# Patient Record
Sex: Male | Born: 1985 | Race: White | Hispanic: No | Marital: Married | Smoking: Never smoker
Health system: Southern US, Community
[De-identification: ages and names within clinical notes are randomized; demographics above are authoritative.]

## PROBLEM LIST (undated history)

## (undated) DIAGNOSIS — Z87442 Personal history of urinary calculi: Secondary | ICD-10-CM

## (undated) DIAGNOSIS — Z8739 Personal history of other diseases of the musculoskeletal system and connective tissue: Secondary | ICD-10-CM

## (undated) HISTORY — PX: TRACHEOSTOMY: SUR1362

## (undated) HISTORY — PX: SKIN GRAFT SPLIT THICKNESS LEG / FOOT: SUR1303

## (undated) HISTORY — PX: OTHER SURGICAL HISTORY: SHX169

---
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
## 2006-11-23 ENCOUNTER — Emergency Department (HOSPITAL_COMMUNITY): Admission: EM | Admit: 2006-11-23 | Discharge: 2006-11-23 | Payer: Self-pay | Admitting: Emergency Medicine

## 2006-12-01 ENCOUNTER — Emergency Department (HOSPITAL_COMMUNITY): Admission: EM | Admit: 2006-12-01 | Discharge: 2006-12-01 | Payer: Self-pay | Admitting: Emergency Medicine
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
## 2016-09-20 DIAGNOSIS — Z452 Encounter for adjustment and management of vascular access device: Secondary | ICD-10-CM | POA: Diagnosis not present

## 2016-09-20 DIAGNOSIS — I10 Essential (primary) hypertension: Secondary | ICD-10-CM | POA: Diagnosis not present

## 2016-09-20 DIAGNOSIS — R918 Other nonspecific abnormal finding of lung field: Secondary | ICD-10-CM | POA: Diagnosis not present

## 2016-09-20 DIAGNOSIS — J15212 Pneumonia due to Methicillin resistant Staphylococcus aureus: Secondary | ICD-10-CM | POA: Diagnosis not present

## 2016-09-20 DIAGNOSIS — E43 Unspecified severe protein-calorie malnutrition: Secondary | ICD-10-CM | POA: Diagnosis not present

## 2016-09-20 DIAGNOSIS — T1490XA Injury, unspecified, initial encounter: Secondary | ICD-10-CM | POA: Diagnosis not present

## 2016-09-20 DIAGNOSIS — J9602 Acute respiratory failure with hypercapnia: Secondary | ICD-10-CM | POA: Diagnosis not present

## 2016-09-20 DIAGNOSIS — T22291A Burn of second degree of multiple sites of right shoulder and upper limb, except wrist and hand, initial encounter: Secondary | ICD-10-CM | POA: Diagnosis not present

## 2016-09-20 DIAGNOSIS — J705 Respiratory conditions due to smoke inhalation: Secondary | ICD-10-CM | POA: Diagnosis not present

## 2016-09-20 DIAGNOSIS — T5891XA Toxic effect of carbon monoxide from unspecified source, accidental (unintentional), initial encounter: Secondary | ICD-10-CM | POA: Diagnosis not present

## 2016-09-20 DIAGNOSIS — Z4682 Encounter for fitting and adjustment of non-vascular catheter: Secondary | ICD-10-CM | POA: Diagnosis not present

## 2016-09-20 DIAGNOSIS — T2029XA Burn of second degree of multiple sites of head, face, and neck, initial encounter: Secondary | ICD-10-CM | POA: Diagnosis not present

## 2016-09-20 DIAGNOSIS — R6510 Systemic inflammatory response syndrome (SIRS) of non-infectious origin without acute organ dysfunction: Secondary | ICD-10-CM | POA: Diagnosis not present

## 2016-09-20 DIAGNOSIS — T310 Burns involving less than 10% of body surface: Secondary | ICD-10-CM | POA: Diagnosis not present

## 2016-09-20 DIAGNOSIS — T300 Burn of unspecified body region, unspecified degree: Secondary | ICD-10-CM | POA: Diagnosis not present

## 2016-09-20 DIAGNOSIS — T3111 Burns involving 10-19% of body surface with 10-19% third degree burns: Secondary | ICD-10-CM | POA: Diagnosis not present

## 2016-09-20 DIAGNOSIS — T22311A Burn of third degree of right forearm, initial encounter: Secondary | ICD-10-CM | POA: Diagnosis not present

## 2016-09-20 DIAGNOSIS — Z9911 Dependence on respirator [ventilator] status: Secondary | ICD-10-CM | POA: Diagnosis not present

## 2016-09-20 DIAGNOSIS — T3110 Burns involving 10-19% of body surface with 0% to 9% third degree burns: Secondary | ICD-10-CM | POA: Diagnosis not present

## 2016-09-20 DIAGNOSIS — J811 Chronic pulmonary edema: Secondary | ICD-10-CM | POA: Diagnosis not present

## 2016-09-20 DIAGNOSIS — J9811 Atelectasis: Secondary | ICD-10-CM | POA: Diagnosis not present

## 2016-09-20 DIAGNOSIS — S299XXA Unspecified injury of thorax, initial encounter: Secondary | ICD-10-CM | POA: Diagnosis not present

## 2016-09-20 DIAGNOSIS — X000XXA Exposure to flames in uncontrolled fire in building or structure, initial encounter: Secondary | ICD-10-CM | POA: Diagnosis not present

## 2016-09-20 DIAGNOSIS — A419 Sepsis, unspecified organism: Secondary | ICD-10-CM | POA: Diagnosis not present

## 2016-09-20 DIAGNOSIS — T22292A Burn of second degree of multiple sites of left shoulder and upper limb, except wrist and hand, initial encounter: Secondary | ICD-10-CM | POA: Diagnosis not present

## 2016-09-20 DIAGNOSIS — J9601 Acute respiratory failure with hypoxia: Secondary | ICD-10-CM | POA: Diagnosis not present

## 2016-09-24 DIAGNOSIS — R0602 Shortness of breath: Secondary | ICD-10-CM | POA: Diagnosis not present

## 2016-09-24 DIAGNOSIS — R4182 Altered mental status, unspecified: Secondary | ICD-10-CM | POA: Diagnosis not present

## 2016-09-24 DIAGNOSIS — J9811 Atelectasis: Secondary | ICD-10-CM | POA: Diagnosis not present

## 2016-09-24 DIAGNOSIS — J9602 Acute respiratory failure with hypercapnia: Secondary | ICD-10-CM | POA: Diagnosis not present

## 2016-09-24 DIAGNOSIS — T5891XA Toxic effect of carbon monoxide from unspecified source, accidental (unintentional), initial encounter: Secondary | ICD-10-CM | POA: Diagnosis not present

## 2016-09-24 DIAGNOSIS — J988 Other specified respiratory disorders: Secondary | ICD-10-CM | POA: Diagnosis not present

## 2016-09-24 DIAGNOSIS — J189 Pneumonia, unspecified organism: Secondary | ICD-10-CM | POA: Diagnosis not present

## 2016-09-24 DIAGNOSIS — D72829 Elevated white blood cell count, unspecified: Secondary | ICD-10-CM | POA: Diagnosis not present

## 2016-09-24 DIAGNOSIS — J90 Pleural effusion, not elsewhere classified: Secondary | ICD-10-CM | POA: Diagnosis not present

## 2016-09-24 DIAGNOSIS — F05 Delirium due to known physiological condition: Secondary | ICD-10-CM | POA: Diagnosis not present

## 2016-09-24 DIAGNOSIS — B9562 Methicillin resistant Staphylococcus aureus infection as the cause of diseases classified elsewhere: Secondary | ICD-10-CM | POA: Diagnosis not present

## 2016-09-24 DIAGNOSIS — Z9911 Dependence on respirator [ventilator] status: Secondary | ICD-10-CM | POA: Diagnosis not present

## 2016-09-24 DIAGNOSIS — R1310 Dysphagia, unspecified: Secondary | ICD-10-CM | POA: Diagnosis not present

## 2016-09-24 DIAGNOSIS — J15212 Pneumonia due to Methicillin resistant Staphylococcus aureus: Secondary | ICD-10-CM | POA: Diagnosis not present

## 2016-09-24 DIAGNOSIS — R509 Fever, unspecified: Secondary | ICD-10-CM | POA: Diagnosis not present

## 2016-09-24 DIAGNOSIS — T2029XA Burn of second degree of multiple sites of head, face, and neck, initial encounter: Secondary | ICD-10-CM | POA: Diagnosis not present

## 2016-09-24 DIAGNOSIS — R0989 Other specified symptoms and signs involving the circulatory and respiratory systems: Secondary | ICD-10-CM | POA: Diagnosis not present

## 2016-09-24 DIAGNOSIS — H532 Diplopia: Secondary | ICD-10-CM | POA: Diagnosis not present

## 2016-09-24 DIAGNOSIS — T3110 Burns involving 10-19% of body surface with 0% to 9% third degree burns: Secondary | ICD-10-CM | POA: Diagnosis not present

## 2016-09-24 DIAGNOSIS — R160 Hepatomegaly, not elsewhere classified: Secondary | ICD-10-CM | POA: Diagnosis not present

## 2016-09-24 DIAGNOSIS — I824Z3 Acute embolism and thrombosis of unspecified deep veins of distal lower extremity, bilateral: Secondary | ICD-10-CM | POA: Diagnosis not present

## 2016-09-24 DIAGNOSIS — Z430 Encounter for attention to tracheostomy: Secondary | ICD-10-CM | POA: Diagnosis not present

## 2016-09-24 DIAGNOSIS — I82403 Acute embolism and thrombosis of unspecified deep veins of lower extremity, bilateral: Secondary | ICD-10-CM | POA: Diagnosis not present

## 2016-09-24 DIAGNOSIS — H5052 Exophoria: Secondary | ICD-10-CM | POA: Diagnosis not present

## 2016-09-24 DIAGNOSIS — K6389 Other specified diseases of intestine: Secondary | ICD-10-CM | POA: Diagnosis not present

## 2016-09-24 DIAGNOSIS — Z930 Tracheostomy status: Secondary | ICD-10-CM | POA: Diagnosis not present

## 2016-09-24 DIAGNOSIS — J95851 Ventilator associated pneumonia: Secondary | ICD-10-CM | POA: Diagnosis not present

## 2016-09-24 DIAGNOSIS — A419 Sepsis, unspecified organism: Secondary | ICD-10-CM | POA: Diagnosis not present

## 2016-09-24 DIAGNOSIS — B964 Proteus (mirabilis) (morganii) as the cause of diseases classified elsewhere: Secondary | ICD-10-CM | POA: Diagnosis not present

## 2016-09-24 DIAGNOSIS — E43 Unspecified severe protein-calorie malnutrition: Secondary | ICD-10-CM | POA: Diagnosis not present

## 2016-09-24 DIAGNOSIS — R000 Tachycardia, unspecified: Secondary | ICD-10-CM | POA: Diagnosis not present

## 2016-09-24 DIAGNOSIS — J9600 Acute respiratory failure, unspecified whether with hypoxia or hypercapnia: Secondary | ICD-10-CM | POA: Diagnosis not present

## 2016-09-24 DIAGNOSIS — J155 Pneumonia due to Escherichia coli: Secondary | ICD-10-CM | POA: Diagnosis not present

## 2016-09-24 DIAGNOSIS — T22312A Burn of third degree of left forearm, initial encounter: Secondary | ICD-10-CM | POA: Diagnosis not present

## 2016-09-24 DIAGNOSIS — Z9889 Other specified postprocedural states: Secondary | ICD-10-CM | POA: Diagnosis not present

## 2016-09-24 DIAGNOSIS — J984 Other disorders of lung: Secondary | ICD-10-CM | POA: Diagnosis not present

## 2016-09-24 DIAGNOSIS — T300 Burn of unspecified body region, unspecified degree: Secondary | ICD-10-CM | POA: Diagnosis not present

## 2016-09-24 DIAGNOSIS — J9601 Acute respiratory failure with hypoxia: Secondary | ICD-10-CM | POA: Diagnosis not present

## 2016-09-24 DIAGNOSIS — T3111 Burns involving 10-19% of body surface with 10-19% third degree burns: Secondary | ICD-10-CM | POA: Diagnosis not present

## 2016-09-24 DIAGNOSIS — D62 Acute posthemorrhagic anemia: Secondary | ICD-10-CM | POA: Diagnosis not present

## 2016-09-24 DIAGNOSIS — T23392A Burn of third degree of multiple sites of left wrist and hand, initial encounter: Secondary | ICD-10-CM | POA: Diagnosis not present

## 2016-09-24 DIAGNOSIS — R410 Disorientation, unspecified: Secondary | ICD-10-CM | POA: Diagnosis not present

## 2016-09-24 DIAGNOSIS — T23361A Burn of third degree of back of right hand, initial encounter: Secondary | ICD-10-CM | POA: Diagnosis not present

## 2016-09-24 DIAGNOSIS — Z4682 Encounter for fitting and adjustment of non-vascular catheter: Secondary | ICD-10-CM | POA: Diagnosis not present

## 2016-09-24 DIAGNOSIS — T23362A Burn of third degree of back of left hand, initial encounter: Secondary | ICD-10-CM | POA: Diagnosis not present

## 2016-09-24 DIAGNOSIS — R6521 Severe sepsis with septic shock: Secondary | ICD-10-CM | POA: Diagnosis not present

## 2016-09-24 DIAGNOSIS — T1490XA Injury, unspecified, initial encounter: Secondary | ICD-10-CM | POA: Diagnosis not present

## 2016-09-24 DIAGNOSIS — R579 Shock, unspecified: Secondary | ICD-10-CM | POA: Diagnosis not present

## 2016-09-24 DIAGNOSIS — T310 Burns involving less than 10% of body surface: Secondary | ICD-10-CM | POA: Diagnosis not present

## 2016-09-24 DIAGNOSIS — B9620 Unspecified Escherichia coli [E. coli] as the cause of diseases classified elsewhere: Secondary | ICD-10-CM | POA: Diagnosis not present

## 2016-09-24 DIAGNOSIS — T797XXA Traumatic subcutaneous emphysema, initial encounter: Secondary | ICD-10-CM | POA: Diagnosis not present

## 2016-09-24 DIAGNOSIS — R0902 Hypoxemia: Secondary | ICD-10-CM | POA: Diagnosis not present

## 2016-09-24 DIAGNOSIS — J811 Chronic pulmonary edema: Secondary | ICD-10-CM | POA: Diagnosis not present

## 2016-09-24 DIAGNOSIS — T23391A Burn of third degree of multiple sites of right wrist and hand, initial encounter: Secondary | ICD-10-CM | POA: Diagnosis not present

## 2016-09-24 DIAGNOSIS — J705 Respiratory conditions due to smoke inhalation: Secondary | ICD-10-CM | POA: Diagnosis not present

## 2016-09-24 DIAGNOSIS — E873 Alkalosis: Secondary | ICD-10-CM | POA: Diagnosis not present

## 2016-09-24 DIAGNOSIS — T22311A Burn of third degree of right forearm, initial encounter: Secondary | ICD-10-CM | POA: Diagnosis not present

## 2016-09-24 DIAGNOSIS — R358 Other polyuria: Secondary | ICD-10-CM | POA: Diagnosis not present

## 2016-09-24 DIAGNOSIS — Z87898 Personal history of other specified conditions: Secondary | ICD-10-CM | POA: Diagnosis not present

## 2016-09-24 DIAGNOSIS — R918 Other nonspecific abnormal finding of lung field: Secondary | ICD-10-CM | POA: Diagnosis not present

## 2016-09-24 DIAGNOSIS — I2699 Other pulmonary embolism without acute cor pulmonale: Secondary | ICD-10-CM | POA: Diagnosis not present

## 2016-09-24 DIAGNOSIS — R451 Restlessness and agitation: Secondary | ICD-10-CM | POA: Diagnosis not present

## 2016-09-24 DIAGNOSIS — Z01810 Encounter for preprocedural cardiovascular examination: Secondary | ICD-10-CM | POA: Diagnosis not present

## 2016-09-24 DIAGNOSIS — R6510 Systemic inflammatory response syndrome (SIRS) of non-infectious origin without acute organ dysfunction: Secondary | ICD-10-CM | POA: Diagnosis not present

## 2016-09-24 DIAGNOSIS — R197 Diarrhea, unspecified: Secondary | ICD-10-CM | POA: Diagnosis not present

## 2016-09-24 DIAGNOSIS — Z452 Encounter for adjustment and management of vascular access device: Secondary | ICD-10-CM | POA: Diagnosis not present

## 2016-11-07 DIAGNOSIS — M79675 Pain in left toe(s): Secondary | ICD-10-CM | POA: Diagnosis not present

## 2016-11-07 DIAGNOSIS — Z6826 Body mass index (BMI) 26.0-26.9, adult: Secondary | ICD-10-CM | POA: Diagnosis not present

## 2016-11-07 DIAGNOSIS — T22311S Burn of third degree of right forearm, sequela: Secondary | ICD-10-CM | POA: Diagnosis not present

## 2016-11-07 DIAGNOSIS — I82403 Acute embolism and thrombosis of unspecified deep veins of lower extremity, bilateral: Secondary | ICD-10-CM | POA: Diagnosis not present

## 2016-11-07 DIAGNOSIS — T22312S Burn of third degree of left forearm, sequela: Secondary | ICD-10-CM | POA: Diagnosis not present

## 2016-11-07 DIAGNOSIS — M79672 Pain in left foot: Secondary | ICD-10-CM | POA: Diagnosis not present

## 2016-11-14 DIAGNOSIS — T2039XD Burn of third degree of multiple sites of head, face, and neck, subsequent encounter: Secondary | ICD-10-CM | POA: Diagnosis not present

## 2016-11-14 DIAGNOSIS — T22311D Burn of third degree of right forearm, subsequent encounter: Secondary | ICD-10-CM | POA: Diagnosis not present

## 2016-11-14 DIAGNOSIS — T22312D Burn of third degree of left forearm, subsequent encounter: Secondary | ICD-10-CM | POA: Diagnosis not present

## 2016-11-14 DIAGNOSIS — T23302D Burn of third degree of left hand, unspecified site, subsequent encounter: Secondary | ICD-10-CM | POA: Diagnosis not present

## 2016-11-14 DIAGNOSIS — Z7901 Long term (current) use of anticoagulants: Secondary | ICD-10-CM | POA: Diagnosis not present

## 2016-11-14 DIAGNOSIS — T23301D Burn of third degree of right hand, unspecified site, subsequent encounter: Secondary | ICD-10-CM | POA: Diagnosis not present

## 2016-11-28 DIAGNOSIS — T22311A Burn of third degree of right forearm, initial encounter: Secondary | ICD-10-CM | POA: Diagnosis not present

## 2016-11-28 DIAGNOSIS — T22312A Burn of third degree of left forearm, initial encounter: Secondary | ICD-10-CM | POA: Diagnosis not present

## 2016-11-28 DIAGNOSIS — Z945 Skin transplant status: Secondary | ICD-10-CM | POA: Diagnosis not present

## 2016-11-28 DIAGNOSIS — T2039XA Burn of third degree of multiple sites of head, face, and neck, initial encounter: Secondary | ICD-10-CM | POA: Diagnosis not present

## 2016-11-28 DIAGNOSIS — T59811A Toxic effect of smoke, accidental (unintentional), initial encounter: Secondary | ICD-10-CM | POA: Diagnosis not present

## 2016-11-28 DIAGNOSIS — T23301A Burn of third degree of right hand, unspecified site, initial encounter: Secondary | ICD-10-CM | POA: Diagnosis not present

## 2016-11-28 DIAGNOSIS — J705 Respiratory conditions due to smoke inhalation: Secondary | ICD-10-CM | POA: Diagnosis not present

## 2016-11-28 DIAGNOSIS — T23302A Burn of third degree of left hand, unspecified site, initial encounter: Secondary | ICD-10-CM | POA: Diagnosis not present

## 2016-11-28 DIAGNOSIS — T3111 Burns involving 10-19% of body surface with 10-19% third degree burns: Secondary | ICD-10-CM | POA: Diagnosis not present

## 2016-12-12 DIAGNOSIS — T23302D Burn of third degree of left hand, unspecified site, subsequent encounter: Secondary | ICD-10-CM | POA: Diagnosis not present

## 2016-12-12 DIAGNOSIS — T23301D Burn of third degree of right hand, unspecified site, subsequent encounter: Secondary | ICD-10-CM | POA: Diagnosis not present

## 2016-12-12 DIAGNOSIS — T3111 Burns involving 10-19% of body surface with 10-19% third degree burns: Secondary | ICD-10-CM | POA: Diagnosis not present

## 2016-12-12 DIAGNOSIS — T20312D Burn of third degree of left ear [any part, except ear drum], subsequent encounter: Secondary | ICD-10-CM | POA: Diagnosis not present

## 2016-12-12 DIAGNOSIS — T2035XD Burn of third degree of scalp [any part], subsequent encounter: Secondary | ICD-10-CM | POA: Diagnosis not present

## 2016-12-12 DIAGNOSIS — T22311D Burn of third degree of right forearm, subsequent encounter: Secondary | ICD-10-CM | POA: Diagnosis not present

## 2016-12-12 DIAGNOSIS — T22312D Burn of third degree of left forearm, subsequent encounter: Secondary | ICD-10-CM | POA: Diagnosis not present

## 2016-12-12 DIAGNOSIS — T20311D Burn of third degree of right ear [any part, except ear drum], subsequent encounter: Secondary | ICD-10-CM | POA: Diagnosis not present

## 2016-12-26 DIAGNOSIS — T22312D Burn of third degree of left forearm, subsequent encounter: Secondary | ICD-10-CM | POA: Diagnosis not present

## 2016-12-26 DIAGNOSIS — T22311D Burn of third degree of right forearm, subsequent encounter: Secondary | ICD-10-CM | POA: Diagnosis not present

## 2016-12-26 DIAGNOSIS — T310 Burns involving less than 10% of body surface: Secondary | ICD-10-CM | POA: Diagnosis not present

## 2016-12-26 DIAGNOSIS — T23301D Burn of third degree of right hand, unspecified site, subsequent encounter: Secondary | ICD-10-CM | POA: Diagnosis not present

## 2016-12-26 DIAGNOSIS — T20311D Burn of third degree of right ear [any part, except ear drum], subsequent encounter: Secondary | ICD-10-CM | POA: Diagnosis not present

## 2016-12-26 DIAGNOSIS — T20312D Burn of third degree of left ear [any part, except ear drum], subsequent encounter: Secondary | ICD-10-CM | POA: Diagnosis not present

## 2016-12-26 DIAGNOSIS — T23302D Burn of third degree of left hand, unspecified site, subsequent encounter: Secondary | ICD-10-CM | POA: Diagnosis not present

## 2016-12-26 DIAGNOSIS — T2035XD Burn of third degree of scalp [any part], subsequent encounter: Secondary | ICD-10-CM | POA: Diagnosis not present

## 2016-12-26 DIAGNOSIS — F05 Delirium due to known physiological condition: Secondary | ICD-10-CM | POA: Diagnosis not present

## 2016-12-27 MED FILL — XARELTO 20 MG TABLET: 20 | 30 days supply | Qty: 30 | Fill #0

## 2017-01-02 DIAGNOSIS — M79672 Pain in left foot: Secondary | ICD-10-CM | POA: Diagnosis not present

## 2017-01-02 DIAGNOSIS — T22312S Burn of third degree of left forearm, sequela: Secondary | ICD-10-CM | POA: Diagnosis not present

## 2017-01-02 DIAGNOSIS — M79675 Pain in left toe(s): Secondary | ICD-10-CM | POA: Diagnosis not present

## 2017-01-02 DIAGNOSIS — I82403 Acute embolism and thrombosis of unspecified deep veins of lower extremity, bilateral: Secondary | ICD-10-CM | POA: Diagnosis not present

## 2017-01-02 DIAGNOSIS — Z6826 Body mass index (BMI) 26.0-26.9, adult: Secondary | ICD-10-CM | POA: Diagnosis not present

## 2017-01-02 DIAGNOSIS — T22311S Burn of third degree of right forearm, sequela: Secondary | ICD-10-CM | POA: Diagnosis not present

## 2017-01-30 MED FILL — XARELTO 20 MG TABLET: 20 | 30 days supply | Qty: 30 | Fill #1

## 2017-03-06 MED FILL — XARELTO 20 MG TABLET: 20 | 30 days supply | Qty: 30 | Fill #0

## 2017-03-29 DIAGNOSIS — T20011D Burn of unspecified degree of right ear [any part, except ear drum], subsequent encounter: Secondary | ICD-10-CM | POA: Diagnosis not present

## 2017-03-29 DIAGNOSIS — Z1389 Encounter for screening for other disorder: Secondary | ICD-10-CM | POA: Diagnosis not present

## 2017-03-29 DIAGNOSIS — T20012D Burn of unspecified degree of left ear [any part, except ear drum], subsequent encounter: Secondary | ICD-10-CM | POA: Diagnosis not present

## 2017-03-29 DIAGNOSIS — T2005XD Burn of unspecified degree of scalp [any part], subsequent encounter: Secondary | ICD-10-CM | POA: Diagnosis not present

## 2017-03-29 DIAGNOSIS — T23001D Burn of unspecified degree of right hand, unspecified site, subsequent encounter: Secondary | ICD-10-CM | POA: Diagnosis not present

## 2017-03-29 DIAGNOSIS — T22011D Burn of unspecified degree of right forearm, subsequent encounter: Secondary | ICD-10-CM | POA: Diagnosis not present

## 2017-03-29 DIAGNOSIS — T3110 Burns involving 10-19% of body surface with 0% to 9% third degree burns: Secondary | ICD-10-CM | POA: Diagnosis not present

## 2017-03-29 DIAGNOSIS — M79675 Pain in left toe(s): Secondary | ICD-10-CM | POA: Diagnosis not present

## 2017-03-29 DIAGNOSIS — T22012D Burn of unspecified degree of left forearm, subsequent encounter: Secondary | ICD-10-CM | POA: Diagnosis not present

## 2017-03-29 DIAGNOSIS — T22311S Burn of third degree of right forearm, sequela: Secondary | ICD-10-CM | POA: Diagnosis not present

## 2017-03-29 DIAGNOSIS — M79672 Pain in left foot: Secondary | ICD-10-CM | POA: Diagnosis not present

## 2017-03-29 DIAGNOSIS — T22312S Burn of third degree of left forearm, sequela: Secondary | ICD-10-CM | POA: Diagnosis not present

## 2017-03-29 DIAGNOSIS — Z6826 Body mass index (BMI) 26.0-26.9, adult: Secondary | ICD-10-CM | POA: Diagnosis not present

## 2017-03-29 DIAGNOSIS — T2009XD Burn of unspecified degree of multiple sites of head, face, and neck, subsequent encounter: Secondary | ICD-10-CM | POA: Diagnosis not present

## 2017-03-29 DIAGNOSIS — T23002D Burn of unspecified degree of left hand, unspecified site, subsequent encounter: Secondary | ICD-10-CM | POA: Diagnosis not present

## 2017-03-29 DIAGNOSIS — I82403 Acute embolism and thrombosis of unspecified deep veins of lower extremity, bilateral: Secondary | ICD-10-CM | POA: Diagnosis not present

## 2017-04-02 ENCOUNTER — Other Ambulatory Visit (HOSPITAL_COMMUNITY): Payer: Self-pay | Admitting: Physician Assistant

## 2017-04-02 DIAGNOSIS — Z86711 Personal history of pulmonary embolism: Secondary | ICD-10-CM

## 2017-04-02 DIAGNOSIS — R0602 Shortness of breath: Secondary | ICD-10-CM

## 2017-04-08 ENCOUNTER — Encounter (HOSPITAL_COMMUNITY): Payer: Self-pay

## 2017-04-08 ENCOUNTER — Ambulatory Visit (HOSPITAL_COMMUNITY)
Admission: RE | Admit: 2017-04-08 | Discharge: 2017-04-08 | Disposition: A | Discharge status: Home / Self Care | Payer: 59 | Source: Ambulatory Visit | Attending: Physician Assistant | Admitting: Physician Assistant

## 2017-04-08 DIAGNOSIS — I82403 Acute embolism and thrombosis of unspecified deep veins of lower extremity, bilateral: Secondary | ICD-10-CM | POA: Insufficient documentation

## 2017-04-08 DIAGNOSIS — R0602 Shortness of breath: Secondary | ICD-10-CM | POA: Insufficient documentation

## 2017-04-08 DIAGNOSIS — Z86711 Personal history of pulmonary embolism: Secondary | ICD-10-CM

## 2017-04-08 MED ORDER — IOPAMIDOL (ISOVUE-370) INJECTION 76%
100.0000 mL | Freq: Once | INTRAVENOUS | Status: AC | PRN
  Administered 2017-04-08: 100 mL via INTRAVENOUS

## 2017-05-28 DIAGNOSIS — Z6828 Body mass index (BMI) 28.0-28.9, adult: Secondary | ICD-10-CM | POA: Diagnosis not present

## 2017-05-28 DIAGNOSIS — R361 Hematospermia: Secondary | ICD-10-CM | POA: Diagnosis not present

## 2017-06-05 DIAGNOSIS — T2039XS Burn of third degree of multiple sites of head, face, and neck, sequela: Secondary | ICD-10-CM | POA: Diagnosis not present

## 2017-06-05 DIAGNOSIS — T22312S Burn of third degree of left forearm, sequela: Secondary | ICD-10-CM | POA: Diagnosis not present

## 2017-06-05 DIAGNOSIS — T23301S Burn of third degree of right hand, unspecified site, sequela: Secondary | ICD-10-CM | POA: Diagnosis not present

## 2017-06-05 DIAGNOSIS — L905 Scar conditions and fibrosis of skin: Secondary | ICD-10-CM | POA: Diagnosis not present

## 2017-06-05 DIAGNOSIS — Z79899 Other long term (current) drug therapy: Secondary | ICD-10-CM | POA: Diagnosis not present

## 2017-06-05 DIAGNOSIS — T22311S Burn of third degree of right forearm, sequela: Secondary | ICD-10-CM | POA: Diagnosis not present

## 2017-06-05 DIAGNOSIS — I998 Other disorder of circulatory system: Secondary | ICD-10-CM | POA: Diagnosis not present

## 2017-06-05 DIAGNOSIS — T23302S Burn of third degree of left hand, unspecified site, sequela: Secondary | ICD-10-CM | POA: Diagnosis not present

## 2017-12-25 DIAGNOSIS — Z6829 Body mass index (BMI) 29.0-29.9, adult: Secondary | ICD-10-CM | POA: Diagnosis not present

## 2017-12-25 DIAGNOSIS — J09X2 Influenza due to identified novel influenza A virus with other respiratory manifestations: Secondary | ICD-10-CM | POA: Diagnosis not present

## 2018-03-17 DIAGNOSIS — R42 Dizziness and giddiness: Secondary | ICD-10-CM | POA: Diagnosis not present

## 2018-03-17 DIAGNOSIS — Z6829 Body mass index (BMI) 29.0-29.9, adult: Secondary | ICD-10-CM | POA: Diagnosis not present

## 2019-03-23 ENCOUNTER — Other Ambulatory Visit: Payer: Self-pay

## 2019-03-23 ENCOUNTER — Other Ambulatory Visit: Payer: Self-pay | Admitting: Internal Medicine

## 2019-03-23 DIAGNOSIS — Z20822 Contact with and (suspected) exposure to covid-19: Secondary | ICD-10-CM

## 2019-03-28 LAB — NOVEL CORONAVIRUS, NAA: SARS-CoV-2, NAA: NOT DETECTED

## 2019-08-28 ENCOUNTER — Other Ambulatory Visit (HOSPITAL_COMMUNITY): Payer: Self-pay | Admitting: Physician Assistant

## 2019-08-28 DIAGNOSIS — R1032 Left lower quadrant pain: Secondary | ICD-10-CM

## 2019-09-21 ENCOUNTER — Other Ambulatory Visit: Payer: Self-pay

## 2019-09-21 ENCOUNTER — Ambulatory Visit (HOSPITAL_COMMUNITY)
Admission: RE | Admit: 2019-09-21 | Discharge: 2019-09-21 | Disposition: A | Discharge status: Home / Self Care | Payer: No Typology Code available for payment source | Source: Ambulatory Visit | Attending: Physician Assistant | Admitting: Physician Assistant

## 2019-09-21 DIAGNOSIS — R1032 Left lower quadrant pain: Secondary | ICD-10-CM | POA: Insufficient documentation

## 2019-09-21 MED ORDER — IOHEXOL 300 MG/ML  SOLN
100.0000 mL | Freq: Once | INTRAMUSCULAR | Status: AC | PRN
  Administered 2019-09-21: 11:00:00 100 mL via INTRAVENOUS

## 2020-06-24 ENCOUNTER — Other Ambulatory Visit: Payer: Self-pay | Admitting: Family Medicine

## 2020-06-24 DIAGNOSIS — K4090 Unilateral inguinal hernia, without obstruction or gangrene, not specified as recurrent: Secondary | ICD-10-CM

## 2020-07-05 ENCOUNTER — Ambulatory Visit (INDEPENDENT_AMBULATORY_CARE_PROVIDER_SITE_OTHER): Payer: No Typology Code available for payment source | Admitting: General Surgery

## 2020-07-05 ENCOUNTER — Encounter: Payer: Self-pay | Admitting: General Surgery

## 2020-07-05 ENCOUNTER — Other Ambulatory Visit: Payer: Self-pay

## 2020-07-05 VITALS — BP 130/88 | HR 82 | Temp 98.6°F | Resp 14 | Ht 74.0 in | Wt 231.0 lb

## 2020-07-05 DIAGNOSIS — K4090 Unilateral inguinal hernia, without obstruction or gangrene, not specified as recurrent: Secondary | ICD-10-CM | POA: Diagnosis not present

## 2020-07-05 NOTE — Patient Instructions (Signed)

## 2020-07-06 NOTE — Progress Notes (Signed)
Stephen Wright; [id number]; 03/01/1986   HPI Patient is a 34-year-old white male who was referred to my care for evaluation and treatment of a left inguinal hernia.  Patient states he has had pain intermittently for the past year.  He has had extensive work-up but recently was found to have a small left inguinal hernia.  Is made worse with straining.  It does not hurt all the time.  He does have occasional burning sensation which extends down to his scrotum and left inner thigh.  No nausea or vomiting have been noted. History reviewed. No pertinent past medical history.  History reviewed. No pertinent surgical history.  History reviewed. No pertinent family history.  Current Outpatient Medications on File Prior to Visit  Medication Sig Dispense Refill  . glucosamine-chondroitin 500-400 MG tablet Take 1 tablet by mouth daily.    . MULTIPLE VITAMIN PO Take by mouth.     No current facility-administered medications on file prior to visit.    No Known Allergies  Social History   Substance and Sexual Activity  Alcohol Use Yes   Comment: Occasional    Social History   Tobacco Use  Smoking Status Never Smoker  Smokeless Tobacco Never Used    Review of Systems  Constitutional: Negative.   HENT: Negative.   Eyes: Negative.   Respiratory: Negative.   Cardiovascular: Negative.   Gastrointestinal: Positive for abdominal pain.  Genitourinary: Negative.   Musculoskeletal: Negative.   Skin: Negative.   Neurological: Negative.   Endo/Heme/Allergies: Negative.   Psychiatric/Behavioral: Negative.     Objective   Vitals:   07/05/20 1424  BP: 130/88  Pulse: 82  Resp: 14  Temp: 98.6 F (37 C)  SpO2: 97%    Physical Exam Vitals reviewed.  Constitutional:      Appearance: Normal appearance. He is not ill-appearing.  HENT:     Head: Normocephalic and atraumatic.  Cardiovascular:     Rate and Rhythm: Normal rate and regular rhythm.     Heart sounds: Normal heart sounds. No  murmur heard.  No friction rub. No gallop.   Pulmonary:     Effort: Pulmonary effort is normal. No respiratory distress.     Breath sounds: Normal breath sounds. No stridor. No wheezing, rhonchi or rales.  Abdominal:     General: Bowel sounds are normal. There is no distension.     Palpations: Abdomen is soft. There is no mass.     Tenderness: There is no abdominal tenderness. There is no guarding or rebound.     Hernia: A hernia is present.     Comments: Small easily reducible left inguinal hernia noted.  He also has a small right inguinal hernia.  Genitourinary:    Comments: Genitourinary examination is within normal limits. Skin:    General: Skin is warm and dry.  Neurological:     Mental Status: He is alert and oriented to person, place, and time.    Primary care notes reviewed Assessment  Symptomatic left inguinal hernia Asymptomatic right inguinal hernia Plan   Patient will call to schedule a left inguinal herniorrhaphy with mesh after the first of the year.  The risks and benefits of the procedure including bleeding, infection, mesh use, and the possibility of recurrence of the hernia were fully explained to the patient, who gave informed consent.  

## 2020-10-11 NOTE — H&P (Signed)
Stephen Wright; [id number]; April 18, 1986   HPI Patient is a 35 year old white male who was referred to my care for evaluation and treatment of a left inguinal hernia.  Patient states he has had pain intermittently for the past year.  He has had extensive work-up but recently was found to have a small left inguinal hernia.  Is made worse with straining.  It does not hurt all the time.  He does have occasional burning sensation which extends down to his scrotum and left inner thigh.  No nausea or vomiting have been noted. History reviewed. No pertinent past medical history.  History reviewed. No pertinent surgical history.  History reviewed. No pertinent family history.  Current Outpatient Medications on File Prior to Visit  Medication Sig Dispense Refill  . glucosamine-chondroitin 500-400 MG tablet Take 1 tablet by mouth daily.    . MULTIPLE VITAMIN PO Take by mouth.     No current facility-administered medications on file prior to visit.    No Known Allergies  Social History   Substance and Sexual Activity  Alcohol Use Yes   Comment: Occasional    Social History   Tobacco Use  Smoking Status Never Smoker  Smokeless Tobacco Never Used    Review of Systems  Constitutional: Negative.   HENT: Negative.   Eyes: Negative.   Respiratory: Negative.   Cardiovascular: Negative.   Gastrointestinal: Positive for abdominal pain.  Genitourinary: Negative.   Musculoskeletal: Negative.   Skin: Negative.   Neurological: Negative.   Endo/Heme/Allergies: Negative.   Psychiatric/Behavioral: Negative.     Objective   Vitals:   07/05/20 1424  BP: 130/88  Pulse: 82  Resp: 14  Temp: 98.6 F (37 C)  SpO2: 97%    Physical Exam Vitals reviewed.  Constitutional:      Appearance: Normal appearance. He is not ill-appearing.  HENT:     Head: Normocephalic and atraumatic.  Cardiovascular:     Rate and Rhythm: Normal rate and regular rhythm.     Heart sounds: Normal heart sounds. No  murmur heard.  No friction rub. No gallop.   Pulmonary:     Effort: Pulmonary effort is normal. No respiratory distress.     Breath sounds: Normal breath sounds. No stridor. No wheezing, rhonchi or rales.  Abdominal:     General: Bowel sounds are normal. There is no distension.     Palpations: Abdomen is soft. There is no mass.     Tenderness: There is no abdominal tenderness. There is no guarding or rebound.     Hernia: A hernia is present.     Comments: Small easily reducible left inguinal hernia noted.  He also has a small right inguinal hernia.  Genitourinary:    Comments: Genitourinary examination is within normal limits. Skin:    General: Skin is warm and dry.  Neurological:     Mental Status: He is alert and oriented to person, place, and time.    Primary care notes reviewed Assessment  Symptomatic left inguinal hernia Asymptomatic right inguinal hernia Plan   Patient will call to schedule a left inguinal herniorrhaphy with mesh after the first of the year.  The risks and benefits of the procedure including bleeding, infection, mesh use, and the possibility of recurrence of the hernia were fully explained to the patient, who gave informed consent.

## 2020-10-18 ENCOUNTER — Other Ambulatory Visit: Payer: Self-pay

## 2020-10-18 ENCOUNTER — Encounter (HOSPITAL_COMMUNITY)
Admission: RE | Admit: 2020-10-18 | Discharge: 2020-10-18 | Disposition: A | Discharge status: Home / Self Care | Payer: No Typology Code available for payment source | Source: Ambulatory Visit | Attending: General Surgery | Admitting: General Surgery

## 2020-10-18 ENCOUNTER — Encounter (HOSPITAL_COMMUNITY): Payer: Self-pay

## 2020-10-18 HISTORY — DX: Personal history of urinary calculi: Z87.442

## 2020-10-18 HISTORY — DX: Personal history of other diseases of the musculoskeletal system and connective tissue: Z87.39

## 2020-10-19 ENCOUNTER — Other Ambulatory Visit (HOSPITAL_COMMUNITY): Payer: No Typology Code available for payment source

## 2020-10-21 ENCOUNTER — Other Ambulatory Visit: Payer: Self-pay

## 2020-10-21 ENCOUNTER — Other Ambulatory Visit (HOSPITAL_COMMUNITY)
Admission: RE | Admit: 2020-10-21 | Discharge: 2020-10-21 | Disposition: A | Discharge status: Home / Self Care | Payer: No Typology Code available for payment source | Source: Ambulatory Visit | Attending: General Surgery | Admitting: General Surgery

## 2020-10-21 DIAGNOSIS — Z01812 Encounter for preprocedural laboratory examination: Secondary | ICD-10-CM | POA: Diagnosis present

## 2020-10-21 DIAGNOSIS — Z20822 Contact with and (suspected) exposure to covid-19: Secondary | ICD-10-CM | POA: Diagnosis not present

## 2020-10-22 LAB — SARS CORONAVIRUS 2 (TAT 6-24 HRS): SARS Coronavirus 2: NEGATIVE

## 2020-10-24 ENCOUNTER — Ambulatory Visit (HOSPITAL_COMMUNITY): Payer: No Typology Code available for payment source | Admitting: Anesthesiology

## 2020-10-24 ENCOUNTER — Encounter (HOSPITAL_COMMUNITY)
Admission: RE | Disposition: A | Discharge status: Home / Self Care | Payer: Self-pay | Source: Home / Self Care | Attending: General Surgery

## 2020-10-24 ENCOUNTER — Other Ambulatory Visit: Payer: Self-pay

## 2020-10-24 ENCOUNTER — Encounter (HOSPITAL_COMMUNITY): Payer: Self-pay | Admitting: General Surgery

## 2020-10-24 ENCOUNTER — Ambulatory Visit (HOSPITAL_COMMUNITY)
Admission: RE | Admit: 2020-10-24 | Discharge: 2020-10-24 | Disposition: A | Discharge status: Home / Self Care | Payer: No Typology Code available for payment source | Attending: General Surgery | Admitting: General Surgery

## 2020-10-24 DIAGNOSIS — K4090 Unilateral inguinal hernia, without obstruction or gangrene, not specified as recurrent: Secondary | ICD-10-CM | POA: Diagnosis present

## 2020-10-24 HISTORY — PX: INGUINAL HERNIA REPAIR: SHX194

## 2020-10-24 SURGERY — REPAIR, HERNIA, INGUINAL, ADULT
Anesthesia: General | Site: Inguinal | Laterality: Left

## 2020-10-24 MED ORDER — FENTANYL CITRATE (PF) 250 MCG/5ML IJ SOLN
INTRAMUSCULAR | Status: DC | PRN
  Administered 2020-10-24: 50 ug via INTRAVENOUS
  Administered 2020-10-24: 25 ug via INTRAVENOUS
  Administered 2020-10-24 (×3): 50 ug via INTRAVENOUS
  Administered 2020-10-24: 25 ug via INTRAVENOUS

## 2020-10-24 MED ORDER — LIDOCAINE HCL (PF) 2 % IJ SOLN
INTRAMUSCULAR | Status: AC
  Filled 2020-10-24: qty 5

## 2020-10-24 MED ORDER — PROPOFOL 10 MG/ML IV BOLUS
INTRAVENOUS | Status: DC | PRN
  Administered 2020-10-24: 250 mg via INTRAVENOUS

## 2020-10-24 MED ORDER — LIDOCAINE HCL (CARDIAC) PF 100 MG/5ML IV SOSY
PREFILLED_SYRINGE | INTRAVENOUS | Status: DC | PRN
  Administered 2020-10-24: 100 mg via INTRAVENOUS

## 2020-10-24 MED ORDER — PHENYLEPHRINE HCL (PRESSORS) 10 MG/ML IV SOLN
INTRAVENOUS | Status: DC | PRN
  Administered 2020-10-24 (×2): 80 ug via INTRAVENOUS

## 2020-10-24 MED ORDER — MIDAZOLAM HCL 2 MG/2ML IJ SOLN
INTRAMUSCULAR | Status: DC | PRN
  Administered 2020-10-24: 2 mg via INTRAVENOUS

## 2020-10-24 MED ORDER — HYDROCODONE-ACETAMINOPHEN 10-325 MG PO TABS: 1.0000 | ORAL_TABLET | Freq: Four times a day (QID) | ORAL | 0 refills | Status: AC | PRN

## 2020-10-24 MED ORDER — PROPOFOL 10 MG/ML IV BOLUS
INTRAVENOUS | Status: AC
  Filled 2020-10-24: qty 40

## 2020-10-24 MED ORDER — DEXAMETHASONE SODIUM PHOSPHATE 10 MG/ML IJ SOLN
INTRAMUSCULAR | Status: DC | PRN
  Administered 2020-10-24: 5 mg via INTRAVENOUS

## 2020-10-24 MED ORDER — CEFAZOLIN SODIUM-DEXTROSE 2-4 GM/100ML-% IV SOLN
INTRAVENOUS | Status: AC
  Filled 2020-10-24: qty 100

## 2020-10-24 MED ORDER — LACTATED RINGERS IV SOLN: INTRAVENOUS | Status: DC

## 2020-10-24 MED ORDER — DEXAMETHASONE SODIUM PHOSPHATE 10 MG/ML IJ SOLN
INTRAMUSCULAR | Status: AC
  Filled 2020-10-24: qty 1

## 2020-10-24 MED ORDER — CEFAZOLIN SODIUM-DEXTROSE 2-4 GM/100ML-% IV SOLN
2.0000 g | INTRAVENOUS | Status: AC
  Administered 2020-10-24: 2 g via INTRAVENOUS

## 2020-10-24 MED ORDER — SODIUM CHLORIDE FLUSH 0.9 % IV SOLN
INTRAVENOUS | Status: AC
  Filled 2020-10-24: qty 10

## 2020-10-24 MED ORDER — PROMETHAZINE HCL 25 MG/ML IJ SOLN
6.2500 mg | INTRAMUSCULAR | Status: DC | PRN
  Administered 2020-10-24: 6.25 mg via INTRAVENOUS
  Filled 2020-10-24: qty 1

## 2020-10-24 MED ORDER — SODIUM CHLORIDE 0.9 % IR SOLN
Status: DC | PRN
  Administered 2020-10-24: 1000 mL

## 2020-10-24 MED ORDER — HYDROMORPHONE HCL 1 MG/ML IJ SOLN
0.2500 mg | INTRAMUSCULAR | Status: DC | PRN
  Administered 2020-10-24 (×4): 0.5 mg via INTRAVENOUS
  Filled 2020-10-24 (×4): qty 0.5

## 2020-10-24 MED ORDER — ONDANSETRON HCL 4 MG/2ML IJ SOLN
INTRAMUSCULAR | Status: AC
  Filled 2020-10-24: qty 2

## 2020-10-24 MED ORDER — CHLORHEXIDINE GLUCONATE CLOTH 2 % EX PADS: 6.0000 | MEDICATED_PAD | Freq: Once | CUTANEOUS | Status: DC

## 2020-10-24 MED ORDER — MIDAZOLAM HCL 2 MG/2ML IJ SOLN
INTRAMUSCULAR | Status: AC
  Filled 2020-10-24: qty 2

## 2020-10-24 MED ORDER — MEPERIDINE HCL 50 MG/ML IJ SOLN
6.2500 mg | INTRAMUSCULAR | Status: DC | PRN
Start: 2020-10-24 — End: 2020-10-24

## 2020-10-24 MED ORDER — FENTANYL CITRATE (PF) 250 MCG/5ML IJ SOLN
INTRAMUSCULAR | Status: AC
  Filled 2020-10-24: qty 5

## 2020-10-24 MED ORDER — CHLORHEXIDINE GLUCONATE 0.12 % MT SOLN: 15.0000 mL | Freq: Once | OROMUCOSAL | Status: AC

## 2020-10-24 MED ORDER — BUPIVACAINE LIPOSOME 1.3 % IJ SUSP
INTRAMUSCULAR | Status: AC
  Filled 2020-10-24: qty 20

## 2020-10-24 MED ORDER — ORAL CARE MOUTH RINSE
15.0000 mL | Freq: Once | OROMUCOSAL | Status: AC
  Administered 2020-10-24: 15 mL via OROMUCOSAL

## 2020-10-24 MED ORDER — KETOROLAC TROMETHAMINE 30 MG/ML IJ SOLN
30.0000 mg | Freq: Once | INTRAMUSCULAR | Status: AC
  Administered 2020-10-24: 30 mg via INTRAVENOUS
  Filled 2020-10-24: qty 1

## 2020-10-24 MED ORDER — BUPIVACAINE LIPOSOME 1.3 % IJ SUSP
INTRAMUSCULAR | Status: DC | PRN
  Administered 2020-10-24: 20 mL

## 2020-10-24 MED ORDER — ONDANSETRON HCL 4 MG/2ML IJ SOLN
INTRAMUSCULAR | Status: DC | PRN
  Administered 2020-10-24: 4 mg via INTRAVENOUS

## 2020-10-24 SURGICAL SUPPLY — 38 items
ADH SKN CLS APL DERMABOND .7 (GAUZE/BANDAGES/DRESSINGS) ×1
CLOTH BEACON ORANGE TIMEOUT ST (SAFETY) ×2 IMPLANT
COVER LIGHT HANDLE STERIS (MISCELLANEOUS) ×4 IMPLANT
COVER WAND RF STERILE (DRAPES) ×2 IMPLANT
DERMABOND ADVANCED (GAUZE/BANDAGES/DRESSINGS) ×1
DERMABOND ADVANCED .7 DNX12 (GAUZE/BANDAGES/DRESSINGS) ×1 IMPLANT
DRAIN PENROSE 0.5X18 (DRAIN) ×2 IMPLANT
ELECT REM PT RETURN 9FT ADLT (ELECTROSURGICAL) ×2
ELECTRODE REM PT RTRN 9FT ADLT (ELECTROSURGICAL) ×1 IMPLANT
GAUZE SPONGE 4X4 12PLY STRL (GAUZE/BANDAGES/DRESSINGS) ×2 IMPLANT
GLOVE BIOGEL PI IND STRL 7.0 (GLOVE) ×3 IMPLANT
GLOVE BIOGEL PI INDICATOR 7.0 (GLOVE) ×3
GLOVE ECLIPSE 6.5 STRL STRAW (GLOVE) ×1 IMPLANT
GLOVE ECLIPSE 7.0 STRL STRAW (GLOVE) ×1 IMPLANT
GLOVE SURG SS PI 7.5 STRL IVOR (GLOVE) ×2 IMPLANT
GOWN STRL REUS W/TWL LRG LVL3 (GOWN DISPOSABLE) ×6 IMPLANT
INST SET MINOR GENERAL (KITS) ×2 IMPLANT
KIT TURNOVER KIT A (KITS) ×2 IMPLANT
MANIFOLD NEPTUNE II (INSTRUMENTS) ×2 IMPLANT
MESH MARLEX PLUG MEDIUM (Mesh General) ×1 IMPLANT
NDL HYPO 18GX1.5 BLUNT FILL (NEEDLE) ×1 IMPLANT
NDL HYPO 21X1.5 SAFETY (NEEDLE) ×1 IMPLANT
NEEDLE HYPO 18GX1.5 BLUNT FILL (NEEDLE) ×2 IMPLANT
NEEDLE HYPO 21X1.5 SAFETY (NEEDLE) ×2 IMPLANT
NS IRRIG 1000ML POUR BTL (IV SOLUTION) ×2 IMPLANT
PACK MINOR (CUSTOM PROCEDURE TRAY) ×2 IMPLANT
PAD ARMBOARD 7.5X6 YLW CONV (MISCELLANEOUS) ×2 IMPLANT
PENCIL SMOKE EVACUATOR (MISCELLANEOUS) ×2 IMPLANT
SET BASIN LINEN APH (SET/KITS/TRAYS/PACK) ×2 IMPLANT
SOL PREP PROV IODINE SCRUB 4OZ (MISCELLANEOUS) ×2 IMPLANT
SUT MNCRL AB 4-0 PS2 18 (SUTURE) ×2 IMPLANT
SUT NOVA NAB GS-22 2 2-0 T-19 (SUTURE) ×5 IMPLANT
SUT VIC AB 2-0 CT1 27 (SUTURE) ×2
SUT VIC AB 2-0 CT1 TAPERPNT 27 (SUTURE) ×1 IMPLANT
SUT VIC AB 3-0 SH 27 (SUTURE) ×2
SUT VIC AB 3-0 SH 27X BRD (SUTURE) ×1 IMPLANT
SUT VICRYL AB 2 0 TIES (SUTURE) ×1 IMPLANT
SYR 20ML LL LF (SYRINGE) ×4 IMPLANT

## 2020-10-24 NOTE — Anesthesia Postprocedure Evaluation (Signed)
Anesthesia Post Note  Patient: Stephen Wright  Procedure(s) Performed: HERNIA REPAIR INGUINAL ADULT (Left Inguinal)  Patient location during evaluation: PACU Anesthesia Type: General Level of consciousness: awake and alert and oriented Pain management: satisfactory to patient Vital Signs Assessment: post-procedure vital signs reviewed and stable Respiratory status: spontaneous breathing and respiratory function stable Cardiovascular status: stable Postop Assessment: no apparent nausea or vomiting Anesthetic complications: no   No complications documented.   Last Vitals:  Vitals:   10/24/20 0845 10/24/20 0900  BP: (!) 143/103 (!) 148/96  Pulse: 91 (!) 104  Resp: 16 14  Temp:    SpO2: 99% 93%    Last Pain:  Vitals:   10/24/20 0900  TempSrc:   PainSc: 7                  Lorin Glass

## 2020-10-24 NOTE — Interval H&P Note (Signed)
History and Physical Interval Note:  10/24/2020 7:18 AM  Stephen Wright  has presented today for surgery, with the diagnosis of Left inguinal hernia.  The various methods of treatment have been discussed with the patient and family. After consideration of risks, benefits and other options for treatment, the patient has consented to  Procedure(s): HERNIA REPAIR INGUINAL ADULT (Left) as a surgical intervention.  The patient's history has been reviewed, patient examined, no change in status, stable for surgery.  I have reviewed the patient's chart and labs.  Questions were answered to the patient's satisfaction.     Franky Macho

## 2020-10-24 NOTE — Op Note (Signed)
Patient:  Stephen Wright  DOB:  October 27, 1985  MRN:  [id number]   Preop Diagnosis: Left inguinal hernia  Postop Diagnosis: Same  Procedure: Left inguinal herniorrhaphy with mesh  Surgeon: Franky Macho, MD  Anes: General  Indications: Patient is a 35 year old white male who presents with a symptomatic left inguinal hernia.  The risks and benefits of the procedure including bleeding, infection, mesh use, and the possibility of recurrence of the hernia were fully explained to the patient, who gave informed consent.  Procedure note: The patient was placed in the supine position.  After general anesthesia was administered, the left groin region was prepped and draped using usual sterile technique with Betadine.  Surgical site confirmation was performed.  An incision was made in the left groin region down to the external oblique aponeurosis.  The aponeurosis was incised to the external ring.  A Penrose drain was placed around the spermatic cord.  The vas deferens was noted within the spermatic cord.  The ilioinguinal nerve was identified and retracted superiorly from the operative field.  A large lipoma of the cord was found and this was highly ligated using a 2-0 Vicryl tie.  It was disposed of.  An indirect hernia sac was found.  This was freed away from the spermatic cord up to the peritoneal reflection and inverted.  A medium size Bard PerFix plug was then inserted.  An onlay patch was placed along the floor of the inguinal canal and secured superiorly to the conjoined tendon and inferiorly to the shelving edge of Poupart's ligament using 2-0 Novafil interrupted sutures.  The internal ring was recreated using a 2-0 Novafil interrupted suture.  The external oblique aponeurosis was reapproximated using a 2-0 Vicryl running suture.  The subcutaneous layer was reapproximated using a 3-0 Vicryl interrupted suture.  Exparel was instilled into the surrounding wound.  The skin was closed using a 4-0 Monocryl  subcuticular suture.  Dermabond was applied.  All tape and needle counts were correct at the end of the procedure.  The patient was awakened and transferred to PACU in stable condition.  Complications: None  EBL: Minimal  Specimen: None

## 2020-10-24 NOTE — Anesthesia Procedure Notes (Signed)
Procedure Name: LMA Insertion Date/Time: 10/24/2020 7:32 AM Performed by: Lorin Glass, CRNA Pre-anesthesia Checklist: Patient identified, Emergency Drugs available, Suction available and Patient being monitored Patient Re-evaluated:Patient Re-evaluated prior to induction Oxygen Delivery Method: Circle system utilized Preoxygenation: Pre-oxygenation with 100% oxygen Induction Type: IV induction LMA: LMA inserted LMA Size: 4.0 Tube secured with: Tape Dental Injury: Teeth and Oropharynx as per pre-operative assessment
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
## 2020-10-24 NOTE — Transfer of Care (Signed)
Immediate Anesthesia Transfer of Care Note  Patient: Stephen Wright  Procedure(s) Performed: HERNIA REPAIR INGUINAL ADULT (Left Inguinal)  Patient Location: PACU  Anesthesia Type:General  Level of Consciousness: drowsy  Airway & Oxygen Therapy: Patient Spontanous Breathing and Patient connected to nasal cannula oxygen  Post-op Assessment: Report given to RN and Post -op Vital signs reviewed and stable  Post vital signs: Reviewed and stable  Last Vitals:  Vitals Value Taken Time  BP 129/83   Temp    Pulse 79 10/24/20 0830  Resp 14 10/24/20 0830  SpO2 97 % 10/24/20 0830  Vitals shown include unvalidated device data.  Last Pain:  Vitals:   10/24/20 0659  TempSrc: Oral  PainSc: 0-No pain      Patients Stated Pain Goal: 8 (10/24/20 0659)  Complications: No complications documented.

## 2020-10-24 NOTE — Discharge Instructions (Signed)
Open Hernia Repair, Adult, Care After °The following information offers guidance on how to care for yourself after your procedure. Your health care provider may also give you more specific instructions. If you have problems or questions, contact your health care provider. °What can I expect after the procedure? °After the procedure, it is common to have: °· Mild discomfort. °· Slight bruising. °· Minor swelling. °· Pain in the abdomen. °· A small amount of blood from the incision. °Follow these instructions at home: °Medicines °· Take over-the-counter and prescription medicines only as told by your health care provider. °· Ask your health care provider if the medicine prescribed to you: °? Requires you to avoid driving or using machinery. °? Can cause constipation. You may need to take these actions to prevent or treat constipation: °§ Drink enough fluid to keep your urine pale yellow. °§ Take over-the-counter or prescription medicines. °§ Eat foods that are high in fiber, such as beans, whole grains, and fresh fruits and vegetables. °§ Limit foods that are high in fat and processed sugars, such as fried or sweet foods. °· If you were prescribed an antibiotic medicine, take it as told by your health care provider. Do not stop using the antibiotic even if you start to feel better. °Incision care °· Follow instructions from your health care provider about how to take care of your incision. Make sure you: °? Wash your hands with soap and water for at least 20 seconds before and after you change your bandage (dressing). If soap and water are not available, use hand sanitizer. °? Change your dressing as told by your health care provider. °? Leave stitches (sutures), skin glue, or adhesive strips in place. These skin closures may need to stay in place for 2 weeks or longer. If adhesive strip edges start to loosen and curl up, you may trim the loose edges. Do not remove adhesive strips completely unless your health care  provider tells you to do that. °· Check your incision area every day for signs of infection. Check for: °? More redness, swelling, or pain. °? More fluid or blood. °? Warmth. °? Pus or a bad smell. °· Wear loose, soft clothing while your incision heals.   °Activity °· Rest as told by your health care provider. °· Do not lift anything that is heavier than 10 lb (4.5 kg), or the limit that you are told, until your health care provider says that it is safe. °· Do not play contact sports until your health care provider says that this is safe. °· If you were given a sedative during the procedure, it can affect you for several hours. Do not drive or operate machinery until your health care provider says that it is safe. °· Return to your normal activities as told by your health care provider. Ask your health care provider what activities are safe for you.   °General instructions °· Do not take baths, swim, or use a hot tub until your health care provider approves. Ask your health care provider if you may take showers. You may only be allowed to take sponge baths. °· Hold a pillow over your abdomen when you cough or sneeze. This helps with pain. °· Do not use any products that contain nicotine or tobacco. These products include cigarettes, chewing tobacco, and vaping devices, such as e-cigarettes. If you need help quitting, ask your health care provider. °· Keep all follow-up visits. This is important. °Contact a health care provider if: °· You   have any of these signs of infection: ? More redness, swelling, or pain around your incision. ? More fluid or blood coming from your incision. ? Warmth coming from your incision. ? Pus or a bad smell coming from your incision. ? A fever or chills.  You have blood in your stool (feces).  You have not had a bowel movement in 2-3 days.  Your pain is not controlled with medicine. Get help right away if:  You have chest pain or shortness of breath.  You feel faint or  light-headed.  You have severe pain.  You vomit and your pain is worse.  You have pain, swelling, or redness in a leg. These symptoms may represent a serious problem that is an emergency. Do not wait to see if the symptoms will go away. Get medical help right away. Call your local emergency services (911 in the [place] Do not drive yourself to the hospital. Summary  After an open hernia repair, it is common to have mild discomfort, slight bruising, and minor swelling.  Follow instructions from your health care provider about how to take care of your incision. Check every day for signs of infection.  Do not lift heavy objects or play contact sports until your health care provider says it is safe.  Return to your normal activities as told by your health care provider. Ask your health care provider what activities are safe for you. This information is not intended to replace advice given to you by your health care provider. Make sure you discuss any questions you have with your health care provider. Document Revised: 04/25/2020 Document Reviewed: 04/25/2020 Elsevier Patient Education  2021 Elsevier Inc.   [place] UNTIL Friday JAN October 28, 2020. DO NOT USE ANY NUMBING MEDICATIONS WITHOUT CONSULTING A PHYSICIAN UNTIL AFTER Friday     Bupivacaine Liposomal Suspension for Injection What is this medicine? BUPIVACAINE LIPOSOMAL (bue PIV a kane LIP oh som al) is an anesthetic. It causes loss of feeling in the skin or other tissues. It is used to prevent and to treat pain from some procedures. This medicine may be used for other purposes; ask your health care provider or pharmacist if you have questions. COMMON BRAND NAME(S): EXPAREL What should I tell my health care provider before I take this medicine? They need to know if you have any of these conditions:  G6PD deficiency  heart disease  kidney disease  liver disease  low blood pressure  lung or  breathing disease, like asthma  an unusual or allergic reaction to bupivacaine, other medicines, foods, dyes, or preservatives  pregnant or trying to get pregnant  breast-feeding How should I use this medicine? This medicine is injected into the affected area. It is given by a health care provider in a hospital or clinic setting. Talk to your health care provider about the use of this medicine in children. While it may be given to children as young as 6 years for selected conditions, precautions do apply. Overdosage: If you think you have taken too much of this medicine contact a poison control center or emergency room at once. NOTE: This medicine is only for you. Do not share this medicine with others. What if I miss a dose? This does not apply. What may interact with this medicine? This medicine may interact with the following medications:  acetaminophen  certain antibiotics like dapsone, nitrofurantoin, aminosalicylic acid, sulfonamides  certain medicines for seizures like phenobarbital, phenytoin, valproic acid  chloroquine  cyclophosphamide  flutamide  hydroxyurea  ifosfamide  metoclopramide  nitric oxide  nitroglycerin  nitroprusside  nitrous oxide  other local anesthetics like lidocaine, pramoxine, tetracaine  primaquine  quinine  rasburicase  sulfasalazine This list may not describe all possible interactions. Give your health care provider a list of all the medicines, herbs, non-prescription drugs, or dietary supplements you use. Also tell them if you smoke, drink alcohol, or use illegal drugs. Some items may interact with your medicine. What should I watch for while using this medicine? Your condition will be monitored carefully while you are receiving this medicine. Be careful to avoid injury while the area is numb, and you are not aware of pain. What side effects may I notice from receiving this medicine? Side effects that you should report to your  doctor or health care professional as soon as possible:  allergic reactions like skin rash, itching or hives, swelling of the face, lips, or tongue  seizures  signs and symptoms of a dangerous change in heartbeat or heart rhythm like chest pain; dizziness; fast, irregular heartbeat; palpitations; feeling faint or lightheaded; falls; breathing problems  signs and symptoms of methemoglobinemia such as pale, gray, or blue colored skin; headache; fast heartbeat; shortness of breath; feeling faint or lightheaded, falls; tiredness Side effects that usually do not require medical attention (report to your doctor or health care professional if they continue or are bothersome):  anxious  back pain  changes in taste  changes in vision  constipation  dizziness  fever  nausea, vomiting This list may not describe all possible side effects. Call your doctor for medical advice about side effects. You may report side effects to FDA at 1-800-FDA-1088. Where should I keep my medicine? This drug is given in a hospital or clinic and will not be stored at home. NOTE: This sheet is a summary. It may not cover all possible information. If you have questions about this medicine, talk to your doctor, pharmacist, or health care provider.  2021 Elsevier/Gold Standard (2019-12-17 12:24:57)     General Anesthesia, Adult, Care After This sheet gives you information about how to care for yourself after your procedure. Your health care provider may also give you more specific instructions. If you have problems or questions, contact your health care provider. What can I expect after the procedure? After the procedure, the following side effects are common:  Pain or discomfort at the IV site.  Nausea.  Vomiting.  Sore throat.  Trouble concentrating.  Feeling cold or chills.  Feeling weak or tired.  Sleepiness and fatigue.  Soreness and body aches. These side effects can affect parts of the  body that were not involved in surgery. Follow these instructions at home: For the time period you were told by your health care provider:  Rest.  Do not participate in activities where you could fall or become injured.  Do not drive or use machinery.  Do not drink alcohol.  Do not take sleeping pills or medicines that cause drowsiness.  Do not make important decisions or sign legal documents.  Do not take care of children on your own.   Eating and drinking  Follow any instructions from your health care provider about eating or drinking restrictions.  When you feel hungry, start by eating small amounts of foods that are soft and easy to digest (bland), such as toast. Gradually return to your regular diet.  Drink enough fluid to keep your urine pale yellow.  If you vomit, rehydrate  by drinking water, juice, or clear broth. General instructions  If you have sleep apnea, surgery and certain medicines can increase your risk for breathing problems. Follow instructions from your health care provider about wearing your sleep device: ? Anytime you are sleeping, including during daytime naps. ? While taking prescription pain medicines, sleeping medicines, or medicines that make you drowsy.  Have a responsible adult stay with you for the time you are told. It is important to have someone help care for you until you are awake and alert.  Return to your normal activities as told by your health care provider. Ask your health care provider what activities are safe for you.  Take over-the-counter and prescription medicines only as told by your health care provider.  If you smoke, do not smoke without supervision.  Keep all follow-up visits as told by your health care provider. This is important. Contact a health care provider if:  You have nausea or vomiting that does not get better with medicine.  You cannot eat or drink without vomiting.  You have pain that does not get better with  medicine.  You are unable to pass urine.  You develop a skin rash.  You have a fever.  You have redness around your IV site that gets worse. Get help right away if:  You have difficulty breathing.  You have chest pain.  You have blood in your urine or stool, or you vomit blood. Summary  After the procedure, it is common to have a sore throat or nausea. It is also common to feel tired.  Have a responsible adult stay with you for the time you are told. It is important to have someone help care for you until you are awake and alert.  When you feel hungry, start by eating small amounts of foods that are soft and easy to digest (bland), such as toast. Gradually return to your regular diet.  Drink enough fluid to keep your urine pale yellow.  Return to your normal activities as told by your health care provider. Ask your health care provider what activities are safe for you. This information is not intended to replace advice given to you by your health care provider. Make sure you discuss any questions you have with your health care provider. Document Revised: 05/26/2020 Document Reviewed: 12/24/2019 Elsevier Patient Education  2021 Elsevier Inc.    Acetaminophen; Hydrocodone tablets or capsules What is this medicine? ACETAMINOPHEN; HYDROCODONE (a set a MEE noe fen; hye droe KOE done) is a pain reliever. It is used to treat moderate to severe pain. This medicine may be used for other purposes; ask your health care provider or pharmacist if you have questions. COMMON BRAND NAME(S): Anexsia, Bancap HC, Ceta-Plus, Co-Gesic, Comfortpak, Dolagesic, [place], [address], [address], Hydrogesic, [place], Lorcet HD, Lorcet Plus, Lortab, Margesic H, Maxidone, [place], Polygesic, [place], [place], Retail [employer], Vicodin, Vicodin ES, Vicodin HP, [place] Baseman What should I tell my health care provider before I take this medicine? They need to know if you have any of these conditions:  brain  tumor  drug abuse or addiction  head injury  heart disease  if you often drink alcohol  kidney disease  liver disease  low adrenal gland function  lung disease, asthma, or breathing problems  seizures  stomach or intestine problems  taken an MAOI like Marplan, Nardil, or Parnate in the last 14 days  an unusual or allergic reaction to acetaminophen, hydrocodone, other medicines, foods, dyes, or preservatives  pregnant or  trying to get pregnant  breast-feeding How should I use this medicine? Take this medicine by mouth with a glass of water. Follow the directions on the prescription label. You can take it with or without food. If it upsets your stomach, take it with food. Do not take your medicine more often than directed. A special MedGuide will be given to you by the pharmacist with each prescription and refill. Be sure to read this information carefully each time. Talk to your pediatrician regarding the use of this medicine in children. Special care may be needed. Overdosage: If you think you have taken too much of this medicine contact a poison control center or emergency room at once. NOTE: This medicine is only for you. Do not share this medicine with others. What if I miss a dose? If you miss a dose, take it as soon as you can. If it is almost time for your next dose, take only that dose. Do not take double or extra doses. What may interact with this medicine? This medicine may interact with the following medications:  alcohol  antiviral medicines for HIV or AIDS  atropine  antihistamines for allergy, cough and cold  certain antibiotics like erythromycin, clarithromycin  certain medicines for anxiety or sleep  certain medicines for bladder problems like oxybutynin, tolterodine  certain medicines for depression like amitriptyline, fluoxetine, sertraline  certain medicines for fungal infections like ketoconazole and itraconazole  certain medicines for  Parkinson's disease like benztropine, trihexyphenidyl  certain medicines for seizures like carbamazepine, phenobarbital, phenytoin, primidone  certain medicines for stomach problems like dicyclomine, hyoscyamine  certain medicines for travel sickness like scopolamine  general anesthetics like halothane, isoflurane, methoxyflurane, propofol  ipratropium  local anesthetics like lidocaine, pramoxine, tetracaine  MAOIs like Carbex, Eldepryl, Marplan, Nardil, and Parnate  medicines that relax muscles for surgery  other medicines with acetaminophen  other narcotic medicines for pain or cough  phenothiazines like chlorpromazine, mesoridazine, prochlorperazine, thioridazine  rifampin This list may not describe all possible interactions. Give your health care provider a list of all the medicines, herbs, non-prescription drugs, or dietary supplements you use. Also tell them if you smoke, drink alcohol, or use illegal drugs. Some items may interact with your medicine. What should I watch for while using this medicine? Tell your health care provider if your pain does not go away, if it gets worse, or if you have new or a different type of pain. You may develop tolerance to this drug. Tolerance means that you will need a higher dose of the drug for pain relief. Tolerance is normal and is expected if you take this drug for a long time. There are different types of narcotic drugs (opioids) for pain. If you take more than one type at the same time, you may have more side effects. Give your health care provider a list of all drugs you use. He or she will tell you how much drug to take. Do not take more drug than directed. Get emergency help right away if you have problems breathing. Do not suddenly stop taking your drug because you may develop a severe reaction. Your body becomes used to the drug. This does NOT mean you are addicted. Addiction is a behavior related to getting and using a drug for a  nonmedical reason. If you have pain, you have a medical reason to take pain drug. Your health care provider will tell you how much drug to take. If your health care provider wants you to stop  the drug, the dose will be slowly lowered over time to avoid any side effects. Talk to your health care provider about naloxone and how to get it. Naloxone is an emergency drug used for an opioid overdose. An overdose can happen if you take too much opioid. It can also happen if an opioid is taken with some other drugs or substances, like alcohol. Know the symptoms of an overdose, like trouble breathing, unusually tired or sleepy, or not being able to respond or wake up. Make sure to tell caregivers and close contacts where it is stored. Make sure they know how to use it. After naloxone is given, you must get emergency help right away. Naloxone is a temporary treatment. Repeat doses may be needed. Do not take other drugs that contain acetaminophen with this drug. Many non-prescription drugs contain acetaminophen. Always read labels carefully. If you have questions, ask your health care provider. If you take too much acetaminophen, get medical help right away. Too much acetaminophen can be very dangerous and cause liver damage. Even if you do not have symptoms, it is important to get help right away. You may get drowsy or dizzy. Do not drive, use machinery, or do anything that needs mental alertness until you know how this drug affects you. Do not stand up or sit up quickly, especially if you are an older patient. This reduces the risk of dizzy or fainting spells. Alcohol may interfere with the effect of this drug. Avoid alcoholic drinks. This drug will cause constipation. If you do not have a bowel movement for 3 days, call your health care provider. Your mouth may get dry. Chewing sugarless gum or sucking hard candy and drinking plenty of water may help. Contact your health care provider if the problem does not go away  or is severe. What side effects may I notice from receiving this medicine? Side effects that you should report to your doctor or health care professional as soon as possible:  allergic reactions like skin rash, itching or hives, swelling of the face, lips, or tongue  breathing problems  confusion  redness, blistering, peeling or loosening of the skin, including inside the mouth  signs and symptoms of low blood pressure like dizziness; feeling faint or lightheaded, falls; unusually weak or tired  trouble passing urine or change in the amount of urine  yellowing of the eyes or skin Side effects that usually do not require medical attention (report to your doctor or health care professional if they continue or are bothersome):  constipation  dry mouth  nausea, vomiting  tiredness This list may not describe all possible side effects. Call your doctor for medical advice about side effects. You may report side effects to FDA at 1-800-FDA-1088. Where should I keep my medicine? Keep out of the reach of children. This medicine can be abused. Keep your medicine in a safe place to protect it from theft. Do not share this medicine with anyone. Selling or giving away this medicine is dangerous and against the law. Store at room temperature between 15 and 30 degrees C (59 and 86 degrees F). This medicine may cause harm and death if it is taken by other adults, children, or pets. Return medicine that has not been used to an official disposal site. Contact the DEA at 478 265 8018 or your city/county government to find a site. If you cannot return the medicine, flush it down the toilet. Do not use the medicine after the expiration date. NOTE: This sheet is  a summary. It may not cover all possible information. If you have questions about this medicine, talk to your doctor, pharmacist, or health care provider.  2021 Elsevier/Gold Standard (2019-04-22 12:25:54)

## 2020-10-25 ENCOUNTER — Encounter (HOSPITAL_COMMUNITY): Payer: Self-pay | Admitting: General Surgery

## 2020-11-04 ENCOUNTER — Ambulatory Visit: Payer: Self-pay | Admitting: General Surgery

## 2020-11-05 IMAGING — CT CT ABD-PELV W/ CM
2 of 3 series · 17 of 46 positions shown, 19 images · IV contrast (omnipaque)
Comparison: None.

CLINICAL DATA: Left lower quadrant pain for 3 weeks.

EXAM:
CT ABDOMEN AND PELVIS WITH CONTRAST
TECHNIQUE: Multidetector CT imaging of the abdomen and pelvis was performed
using the standard protocol following bolus administration of
intravenous contrast.
CONTRAST:  100mL OMNIPAQUE IOHEXOL 300 MG/ML  SOLN

[Series 2: axial st · axial · 0.80mm/px · z∈[-746,-306]mm · 14 of 102 slices shown, 16 images]
[im 7/102  soft-tissue]
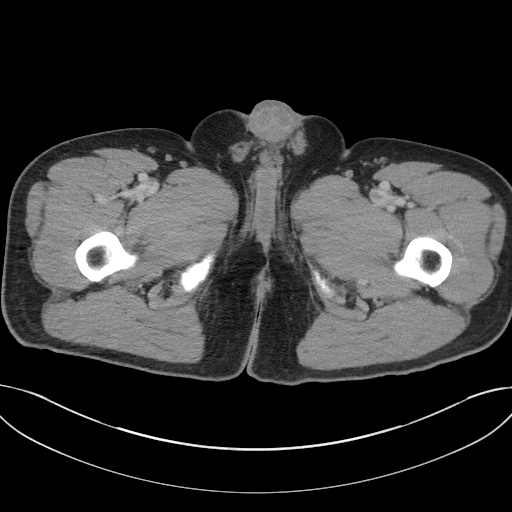
[im 7/102  bone]
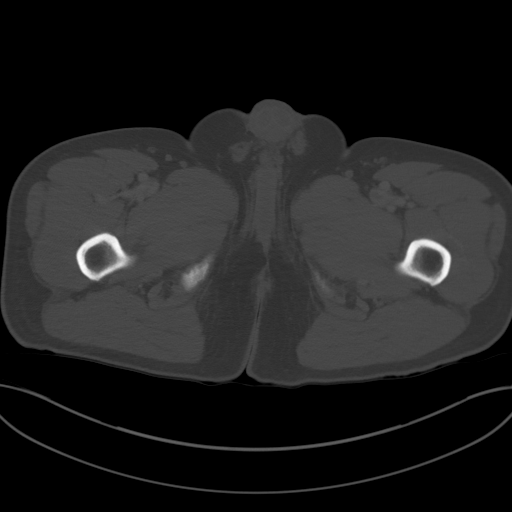
[im 14/102  soft-tissue]
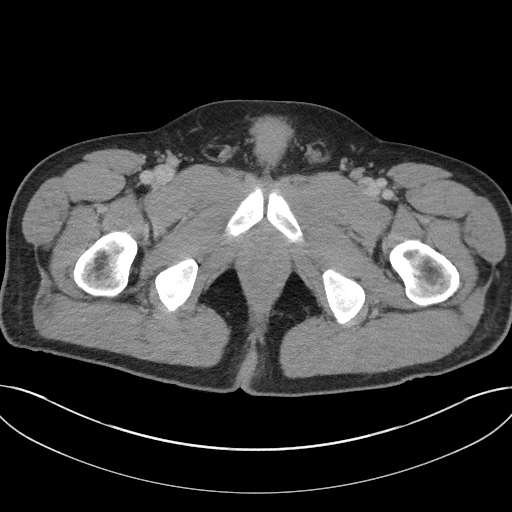
[im 20/102  soft-tissue]
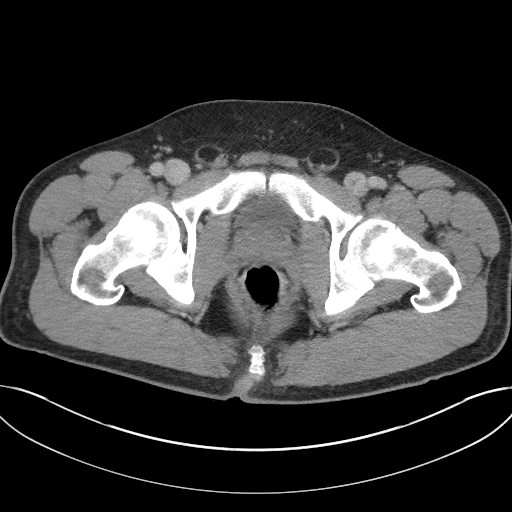
[im 27/102  soft-tissue]
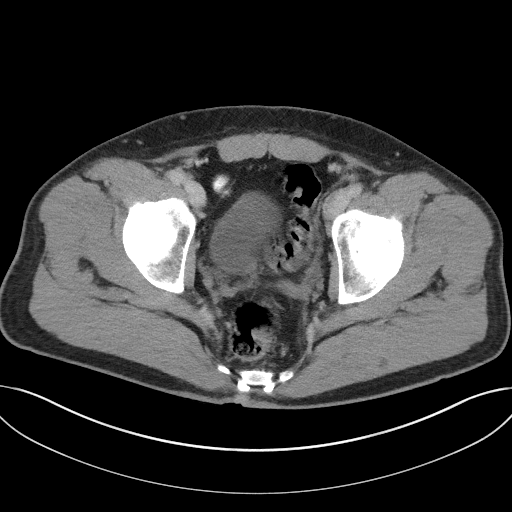
[im 33/102  soft-tissue]
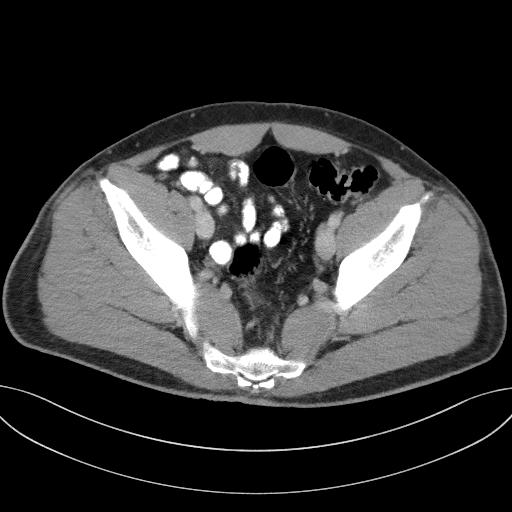
[im 40/102  soft-tissue]
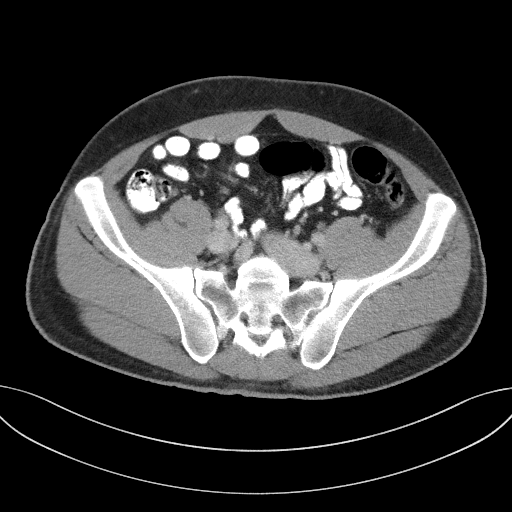
[im 46/102  soft-tissue]
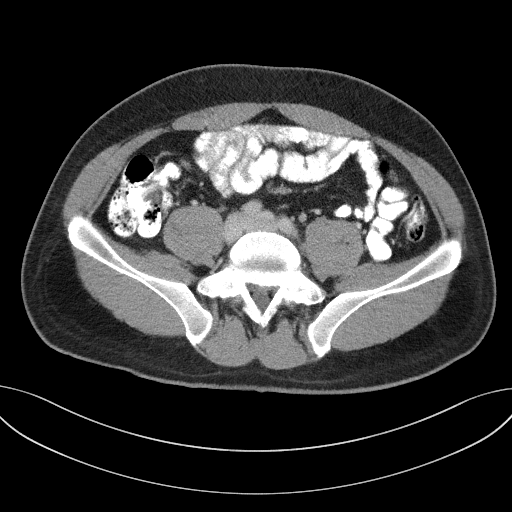
[im 56/102  soft-tissue]
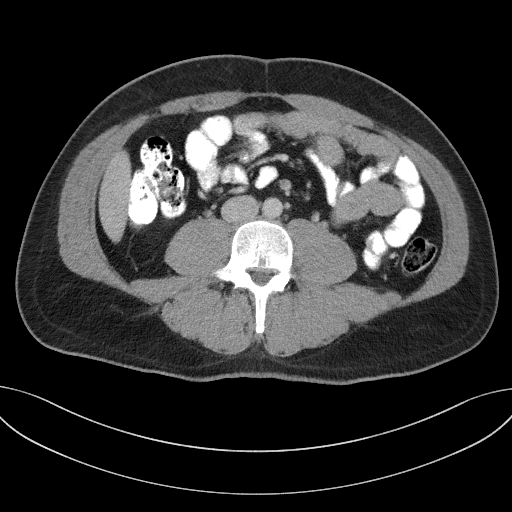
[im 62/102  soft-tissue]
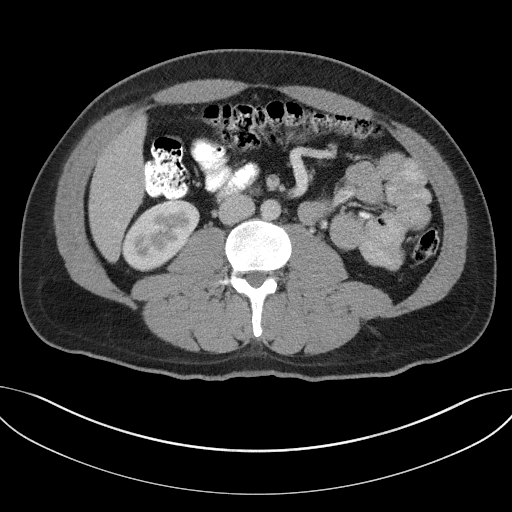
[im 62/102  bone]
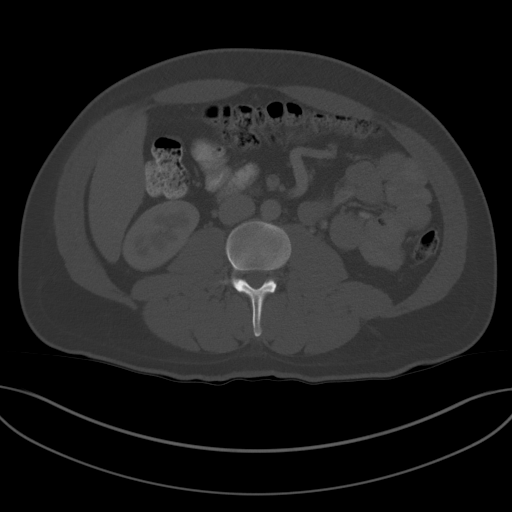
[im 69/102  soft-tissue]
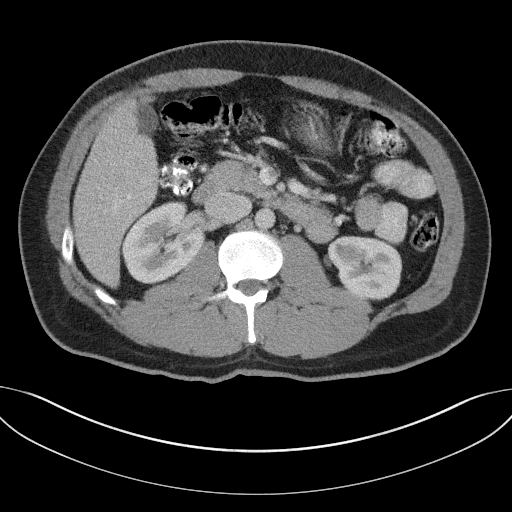
[im 75/102  soft-tissue]
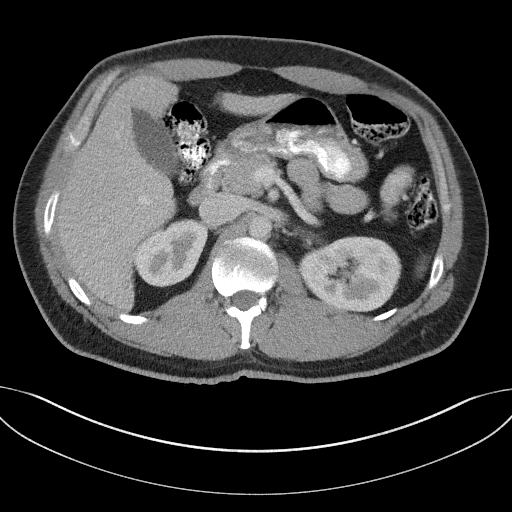
[im 82/102  soft-tissue]
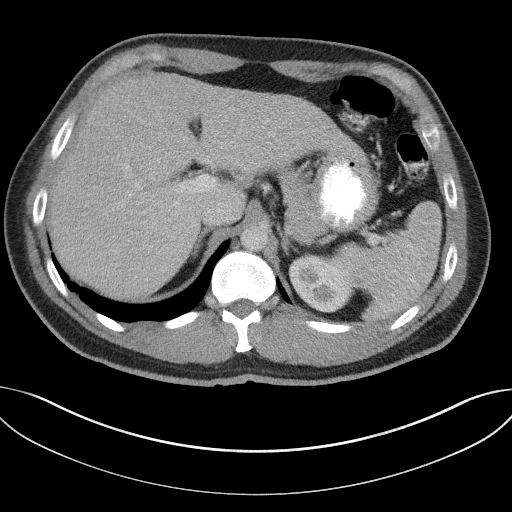
[im 88/102  soft-tissue]
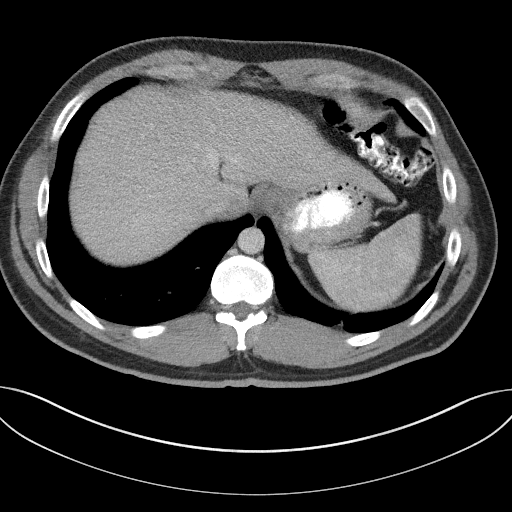
[im 95/102  soft-tissue]
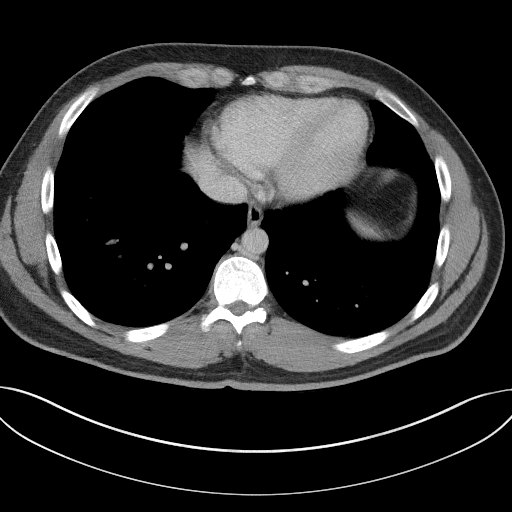

[Series 5: coronal st · coronal · 0.88mm/px · 3 of 117 slices shown]
[im 39/117  soft-tissue]
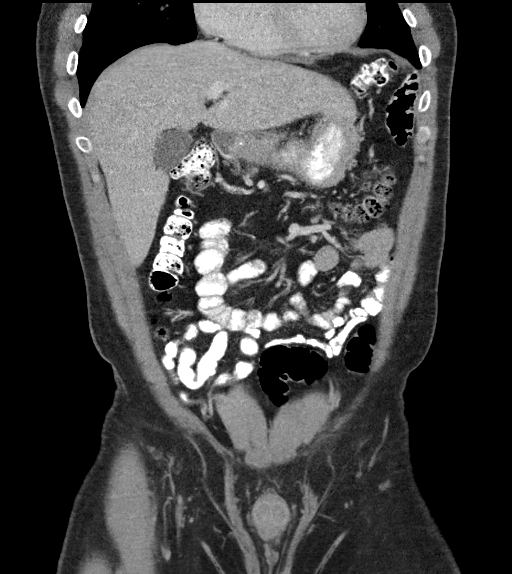
[im 52/117  soft-tissue]
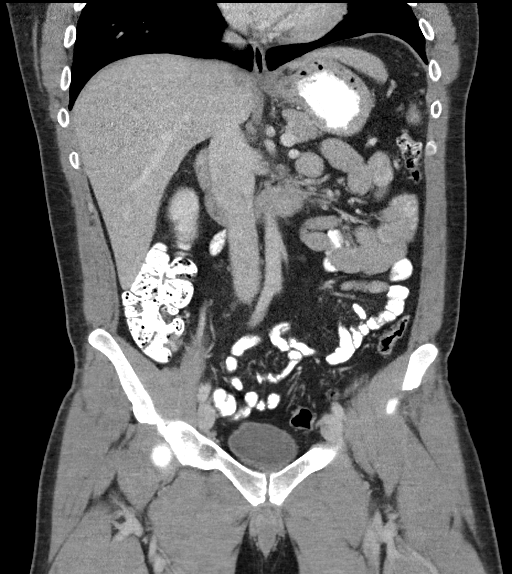
[im 65/117  soft-tissue]
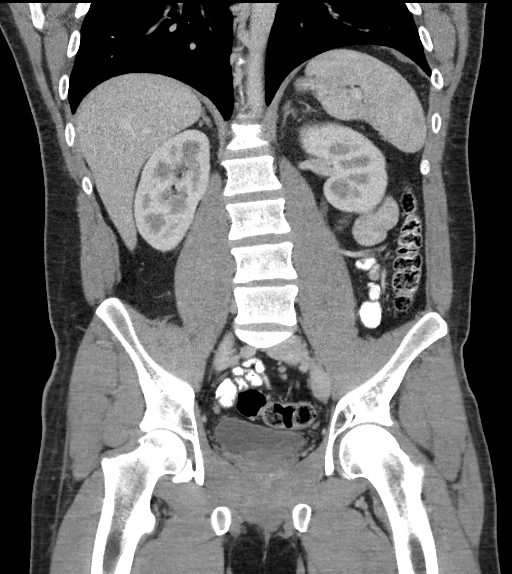

[17 of 46 positions shown; findings below may reference images not displayed]

FINDINGS: Lower Chest: No acute findings.

Hepatobiliary: No hepatic masses identified. Gallbladder is
unremarkable. No evidence of biliary ductal dilatation.

Pancreas:  No mass or inflammatory changes.

Spleen: Within normal limits in size and appearance.

Adrenals/Urinary Tract: A 1-2 mm calculus is seen in the midpole of
the left kidney. No masses identified. No evidence of ureteral
calculi or hydronephrosis.

Stomach/Bowel: No evidence of obstruction, inflammatory process or
abnormal fluid collections. Normal appendix visualized.

Vascular/Lymphatic: No pathologically enlarged lymph nodes. No
abdominal aortic aneurysm.

Reproductive:  No mass or other significant abnormality.

Other:  None.

Musculoskeletal: No suspicious bone lesions identified. Mild wedge
compression deformity of the L1 vertebral body appears chronic.
IMPRESSION: Tiny left renal calculus. No evidence of ureteral calculi,
hydronephrosis, or other acute findings.

## 2020-11-11 ENCOUNTER — Ambulatory Visit (INDEPENDENT_AMBULATORY_CARE_PROVIDER_SITE_OTHER): Payer: Self-pay | Admitting: General Surgery

## 2020-11-11 DIAGNOSIS — Z09 Encounter for follow-up examination after completed treatment for conditions other than malignant neoplasm: Secondary | ICD-10-CM

## 2020-11-11 NOTE — Progress Notes (Signed)
Subjective:     Stephen Wright  Had postoperative virtual telephone visit with the patient's wife as the patient was not available. She states that he is doing very well. He did have some testicular swelling which is resolving. No bruising is present. He is not taking his pain pills anymore. Objective:    There were no vitals taken for this visit.  General:  not applicable       Assessment:    Doing well postoperatively. As per his wife   Plan:   I told her that should he have any questions or concerns, he can contact my office. Follow-up as needed.  Total telephone time 2 minutes. As this is a part of the global surgical fee, this visit is not billable.
